# Patient Record
Sex: Male | Born: 2000
Health system: Southern US, Community
[De-identification: ages and names within clinical notes are randomized; demographics above are authoritative.]

## PROBLEM LIST (undated history)

## (undated) DIAGNOSIS — K589 Irritable bowel syndrome without diarrhea: Secondary | ICD-10-CM

## (undated) DIAGNOSIS — J45909 Unspecified asthma, uncomplicated: Secondary | ICD-10-CM

## (undated) HISTORY — DX: Irritable bowel syndrome without diarrhea: K58.9

## (undated) HISTORY — DX: Irritable bowel syndrome, unspecified: K58.9

---
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
## 2019-01-13 IMAGING — DX DG ABDOMEN 1V
1 series · 1 of 1 positions shown · non-contrast
Comparison: No recent.

CLINICAL DATA: Diarrhea.  Abdominal pain.

EXAM:
ABDOMEN - 1 VIEW

[dg abd 1 view]
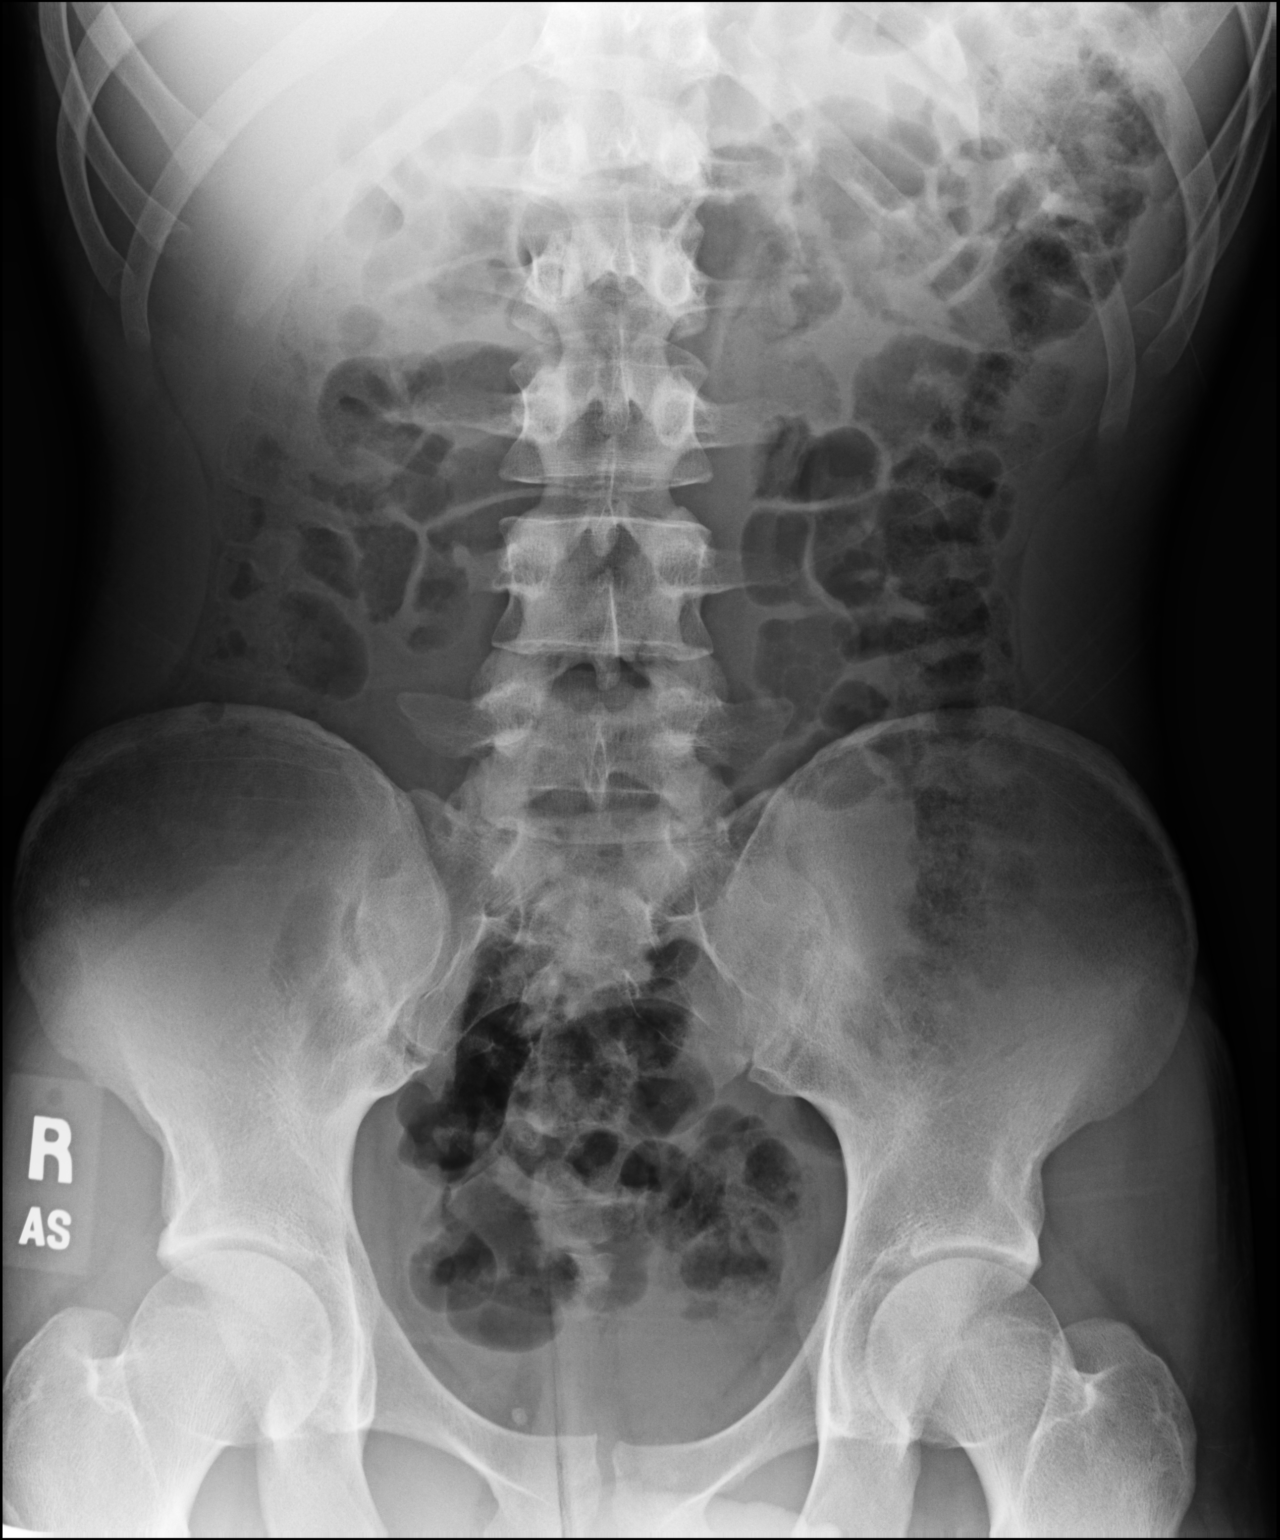

[1 of 1 positions shown; findings below may reference images not displayed]

FINDINGS: Soft tissue structures are unremarkable. No bowel distention or free
air. Pelvic calcifications consistent phleboliths. No acute bony
abnormality.
IMPRESSION: No acute abnormality.

## 2019-01-22 DIAGNOSIS — K580 Irritable bowel syndrome with diarrhea: Secondary | ICD-10-CM | POA: Insufficient documentation

## 2019-01-22 DIAGNOSIS — K589 Irritable bowel syndrome without diarrhea: Secondary | ICD-10-CM | POA: Insufficient documentation

## 2019-05-07 ENCOUNTER — Ambulatory Visit: Payer: Managed Care, Other (non HMO) | Admitting: Physician Assistant

## 2019-05-15 ENCOUNTER — Ambulatory Visit (INDEPENDENT_AMBULATORY_CARE_PROVIDER_SITE_OTHER): Payer: Self-pay | Admitting: Physician Assistant

## 2019-05-15 ENCOUNTER — Encounter: Payer: Self-pay | Admitting: Physician Assistant

## 2019-05-15 ENCOUNTER — Other Ambulatory Visit: Payer: Self-pay

## 2019-05-15 VITALS — BP 131/81 | HR 72 | Temp 97.3°F | Resp 14 | Ht 72.0 in | Wt 165.0 lb

## 2019-05-15 DIAGNOSIS — K582 Mixed irritable bowel syndrome: Secondary | ICD-10-CM

## 2019-05-15 MED ORDER — HYOSCYAMINE SULFATE 0.125 MG SL SUBL: 0.1250 mg | SUBLINGUAL_TABLET | Freq: Four times a day (QID) | SUBLINGUAL | 0 refills | Status: DC | PRN

## 2019-05-15 NOTE — Progress Notes (Signed)
I have discussed the procedure for the virtual visit with the patient who has given consent to proceed with assessment and treatment.   Jarious Lyon S Kensi Karr, CMA     

## 2019-05-15 NOTE — Progress Notes (Signed)
Patient presents to clinic today to establish care. 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 Wants to study engineering. Patient and mother note patient with a history of abdominal cramping and alternating constipation/diarrhea over the past few years. Has been followed by Peds GI with EGD and colonoscopy ruling out IBD. Was diagnosed with IBS-D and started on a diet, daily probiotic and Levsin PRN with good improvement. Still having several episodes of cramping per week. Is not taking Levsin currently. Denies blood in stool, melena, heartburn.  Exercise: 1-2 volleyball   Chronic Issues: Has history of asthma in childhood. No issue as an adult. Does have rescue inhaler just in case.   Health Maintenance: Immunizations -- UTD Colonoscopy -- March 2020.   Past Medical History:  Diagnosis Date  . Asthma     Past Surgical History:  Procedure Laterality Date  . NO PAST SURGERIES      Current Outpatient Medications on File Prior to Visit  Medication Sig Dispense Refill  . albuterol (VENTOLIN HFA) 108 (90 Base) MCG/ACT inhaler Inhale 1-2 puffs into the lungs every 6 (six) hours as needed for wheezing or shortness of breath.    . Aloe Vera 25 MG CAPS Take 1 capsule by mouth daily.    Marland Kitchen. saccharomyces boulardii (FLORASTOR) 250 MG capsule Take 1 tablet by mouth 2 (two) times a day.     No current facility-administered medications on file prior to visit.     No Known Allergies  Family History  Problem Relation Age of Onset  . Hypertension Mother   . Hyperlipidemia Mother   . Asthma Mother   . Melanoma Mother        Last year.  . Hypertension Father   . Healthy Sister   . Diabetes Maternal Grandmother   . Hypertension Maternal Grandmother   . Early death Maternal Grandfather   . Hypertension Paternal Grandmother   . Diabetes Paternal Grandfather   . Hyperlipidemia Paternal Grandfather   . Hypertension Paternal Grandfather   . Cancer Paternal  Grandfather        [facility]    Social History   Socioeconomic History  . Marital status: Single    Spouse name: Not on file  . Number of children: Not on file  . Years of education: Not on file  . Highest education level: Not on file  Occupational History  . Occupation: Consulting civil engineertudent    Comment: [employer]  Social Needs  . Financial resource strain: Not on file  . Food insecurity    Worry: Not on file    Inability: Not on file  . Transportation needs    Medical: Not on file    Non-medical: Not on file  Tobacco Use  . Smoking status: Never Smoker  . Smokeless tobacco: Never Used  Substance and Sexual Activity  . Alcohol use: No  . Drug use: No  . Sexual activity: Not on file  Lifestyle  . Physical activity    Days per week: Not on file    Minutes per session: Not on file  . Stress: Not on file  Relationships  . Social Musicianconnections    Talks on phone: Not on file    Gets together: Not on file    Attends religious service: Not on file    Active member of club or organization: Not on file    Attends meetings of clubs or organizations: Not on file    Relationship status: Not on file  .  Intimate partner violence    Fear of current or ex partner: Not on file    Emotionally abused: Not on file    Physically abused: Not on file    Forced sexual activity: Not on file  Other Topics Concern  . Not on file  Social History Narrative  . Not on file   Review of Systems  Constitutional: Negative for fever and weight loss.  HENT: Negative for ear discharge, ear pain, hearing loss and tinnitus.   Eyes: Negative for blurred vision, double vision, photophobia and pain.  Respiratory: Negative for cough and shortness of breath.   Cardiovascular: Negative for chest pain and palpitations.  Gastrointestinal: Positive for abdominal pain, constipation and diarrhea. Negative for blood in stool, heartburn, melena, nausea and vomiting.  Genitourinary: Negative for dysuria, flank pain,  frequency, hematuria and urgency.  Musculoskeletal: Negative for falls and myalgias.  Neurological: Negative for dizziness, loss of consciousness and headaches.  Endo/Heme/Allergies: Negative for environmental allergies.  Psychiatric/Behavioral: Negative for depression, hallucinations, substance abuse and suicidal ideas. The patient is not nervous/anxious and does not have insomnia.    BP 131/81   Pulse 72   Temp (!) 97.3 F (36.3 C) (Oral)   Resp 14   Ht 6' (1.829 m)   Wt 165 lb (74.8 kg)   BMI 22.38 kg/m   Physical Exam Vitals signs reviewed.  Constitutional:      General: He is not in acute distress.    Appearance: He is well-developed. He is not diaphoretic.  HENT:     Head: Normocephalic and atraumatic.     Right Ear: Tympanic membrane, ear canal and external ear normal.     Left Ear: Tympanic membrane, ear canal and external ear normal.     Nose: Nose normal.     Mouth/Throat:     Pharynx: No posterior oropharyngeal erythema.  Eyes:     Conjunctiva/sclera: Conjunctivae normal.     Pupils: Pupils are equal, round, and reactive to light.  Neck:     Musculoskeletal: Neck supple.     Thyroid: No thyromegaly.  Cardiovascular:     Rate and Rhythm: Normal rate and regular rhythm.     Heart sounds: Normal heart sounds.  Pulmonary:     Effort: Pulmonary effort is normal. No respiratory distress.     Breath sounds: Normal breath sounds. No wheezing or rales.  Chest:     Chest wall: No tenderness.  Abdominal:     General: Bowel sounds are normal. There is no distension.     Palpations: Abdomen is soft. There is no mass.     Tenderness: There is no abdominal tenderness. There is no guarding or rebound.  Lymphadenopathy:     Cervical: No cervical adenopathy.  Skin:    General: Skin is warm and dry.     Findings: No rash.  Neurological:     Mental Status: He is alert and oriented to person, place, and time.     Cranial Nerves: No cranial nerve deficit.     Assessment/Plan: 1. Irritable bowel syndrome with both constipation and diarrhea Restart Levsin. Continue probiotic and dietary changes. Referral to an adult GI practitioner placed.  - Ambulatory referral to Gastroenterology   Leeanne Rio, PA-C

## 2019-06-03 ENCOUNTER — Telehealth: Payer: Self-pay | Admitting: Gastroenterology

## 2019-06-03 NOTE — Telephone Encounter (Signed)
DOD 05-21-19 AM Dr. Fuller Plan  Patient turned 44 and is transferring care from a pediatric doctor.  Patient would like to establish care with our practice. Records will be sent to Dr. Fuller Plan (DOD) for review.

## 2019-06-04 NOTE — Telephone Encounter (Signed)
Left message to call back. Per Dr. Fuller Plan, pt needs a consult visit with him. Records in blue folder.

## 2019-06-18 NOTE — Telephone Encounter (Signed)
Left message to call back to schedule appt with APP per Dr. Lynne Leader note.

## 2019-06-18 NOTE — Telephone Encounter (Signed)
Hi Dr. Fuller Plan, Pt's PCP called requesting patient to be seen soon as he will be going back to school in September. Is it fine to schedule him with one of the APPs? Thank you

## 2019-06-18 NOTE — Telephone Encounter (Signed)
Yes please arrange an appt with an APP and I note that the patient is establishing care with me.

## 2019-06-19 ENCOUNTER — Encounter: Payer: Self-pay | Admitting: Gastroenterology

## 2019-06-19 NOTE — Telephone Encounter (Signed)
Consult scheduled with Janett Billow on 8/21 at 1:30pm.

## 2019-07-12 ENCOUNTER — Encounter: Payer: Self-pay | Admitting: Gastroenterology

## 2019-07-12 ENCOUNTER — Ambulatory Visit: Payer: Managed Care, Other (non HMO) | Admitting: Gastroenterology

## 2019-07-12 ENCOUNTER — Other Ambulatory Visit: Payer: Managed Care, Other (non HMO)

## 2019-07-12 VITALS — BP 110/60 | HR 72 | Temp 98.6°F | Ht 71.0 in | Wt 171.1 lb

## 2019-07-12 DIAGNOSIS — K589 Irritable bowel syndrome without diarrhea: Secondary | ICD-10-CM

## 2019-07-12 MED ORDER — DICYCLOMINE HCL 10 MG PO CAPS: 10.0000 mg | ORAL_CAPSULE | Freq: Two times a day (BID) | ORAL | 2 refills | Status: DC

## 2019-07-12 NOTE — Progress Notes (Signed)
07/12/2019 Matthew Harrington [id number] [birth year]   HISTORY OF PRESENT ILLNESS: This is an 18 year old male who is new to our office.  He is looking to establish care here, particularly with Dr. Fuller Plan, to continue to follow for management of his irritable bowel syndrome.  He was following with pediatric GI in [place], but is looking to transition now that he is 18 years old.  He had a normal EGD and colonoscopy in February of this year.  Is currently taking daily probiotics and using Levsin on an as-needed basis.  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  Looking for something to help him better manage his symptoms, possibly try to prevent symptoms to some degree.  Mostly complains of intermittent lower abdominal cramping.  Says that symptoms occur sometimes 2 or 3 days out of the week.  Hard to identify any particular foods that seem to trigger symptoms.  Gets some diarrhea/loose stools with urgency, but often times it is just the fact that he needs to visit the restroom frequently, not necessarily diarrhea per se.  TSH, sed rate, CRP normal/negative last year.  Does not recall being checked for celiac disease and I do no see that it was ever performed.  Is here today with his mom.   Past Medical History:  Diagnosis Date  . Asthma   . IBS (irritable bowel syndrome)    Past Surgical History:  Procedure Laterality Date  . NO PAST SURGERIES      reports that he has never smoked. He has never used smokeless tobacco. He reports that he does not drink alcohol or use drugs. family history includes Asthma in his mother; Breast cancer in his maternal grandmother; Colon polyps in his mother; Diabetes in his maternal grandmother and paternal grandfather; Early death in his maternal grandfather; Healthy in his sister; Heart disease in his maternal grandmother and mother; Hyperlipidemia in his father, mother, and paternal grandfather; Hypertension in his  father, maternal grandmother, mother, paternal grandfather, and paternal grandmother; Kidney cancer in his paternal grandfather; Melanoma in his mother. No Known Allergies    Outpatient Encounter Medications as of 07/12/2019  Medication Sig  . albuterol (VENTOLIN HFA) 108 (90 Base) MCG/ACT inhaler Inhale 1-2 puffs into the lungs every 6 (six) hours as needed for wheezing or shortness of breath.  . Aloe Vera 25 MG CAPS Take 1 capsule by mouth daily.  Marland Kitchen BLACK ELDERBERRY PO Take 1 Dose by mouth daily.  . cetirizine (ZYRTEC) 10 MG chewable tablet Chew 10 mg by mouth daily.  . Cholecalciferol (VITAMIN D) 50 MCG (2000 UT) tablet Take 2,000 Units by mouth daily.  . fluticasone (FLONASE) 50 MCG/ACT nasal spray Place 1 spray into both nostrils as needed for allergies or rhinitis.  . hyoscyamine (LEVSIN SL) 0.125 MG SL tablet Take 1 tablet (0.125 mg total) by mouth every 6 (six) hours as needed.  . Lactobacillus-Inulin ([place] PO) Take 1 tablet by mouth daily.  . Probiotic Product (PROBIOTIC PO) Take 1 tablet by mouth daily.  . vitamin C (ASCORBIC ACID) 500 MG tablet Take 500 mg by mouth daily.  . [DISCONTINUED] saccharomyces boulardii (FLORASTOR) 250 MG capsule Take 1 tablet by mouth 2 (two) times a day.   No facility-administered encounter medications on file as of 07/12/2019.      REVIEW OF SYSTEMS  : All other systems reviewed and negative except where noted in the History of Present Illness.  PHYSICAL EXAM: BP 110/60 (BP Location: Left Arm, Patient Position: Sitting, Cuff Size: Normal)   Pulse 72   Temp 98.6 F (37 C)   Ht 5\' 11"  (1.803 m) Comment: height measured without shoes  Wt 171 lb 2 oz (77.6 kg)   BMI 23.87 kg/m  General: Well developed white male in no acute distress Head: Normocephalic and atraumatic Eyes:  Sclerae anicteric, conjunctiva pink. Ears: Normal auditory acuity Lungs: Clear throughout to auscultation; no increased WOB. Heart: Regular rate  and rhythm; no M/R/G. Abdomen: Soft, non-distended.  BS present.  Non-tender. Musculoskeletal: Symmetrical with no gross deformities  Skin: No lesions on visible extremities Extremities: No edema  Neurological: Alert oriented x 4, grossly non-focal Psychological:  Alert and cooperative. Normal mood and affect  ASSESSMENT AND PLAN: *18 year old male with anxiety and IBS: [place] but now establishing care here.  Normal colonoscopy and EGD earlier this year.  The only other thing that I would do as far as evaluation would be to check for celiac disease which does not look like it was done in the past.  Will order those labs today.  Otherwise focus on symptom management.  Reports symptoms sometimes 2 or 3 days of the week.  Looking for something to try to prevent symptoms.  Will place him on Bentyl 10 mg once daily, increase to twice daily if needed.  Currently has Levsin, which he is just using as needed and can continue to use that on occasion for more fast acting medication if needed, but advised not to use both together regularly.  Imodium as needed.  Consider adding Benefiber or Citrucel powder daily to help bulk the stools.  Continue daily probiotic.  CC:  Waldon MerlMartin, William C, PA-C

## 2019-07-12 NOTE — Patient Instructions (Signed)
Normal BMI (Body Mass Index- based on height and weight) is between 19 and 25. Your BMI today is Body mass index is 23.87 kg/m. Marland Kitchen Please consider follow up  regarding your BMI with your Primary Care Provider.  Your provider has requested that you go to the basement level for lab work before leaving today. Press "B" on the elevator. The lab is located at the first door on the left as you exit the elevator.   We have sent the following medications to your pharmacy for you to pick up at your convenience: Bentyl  Consider adding Benefiber ir Citrucel fiber supplements daily to help bulk stools.  Thank you for [facility].

## 2019-07-12 NOTE — Progress Notes (Signed)
Reviewed and agree with management plan.  Jhade Berko T. Devlyn Parish, MD FACG [facility]  

## 2019-07-15 LAB — TISSUE TRANSGLUTAMINASE, IGA: (tTG) Ab, IgA: 1 U/mL

## 2019-07-15 LAB — IGA: Immunoglobulin A: 131 mg/dL (ref 47–310)

## 2019-11-09 ENCOUNTER — Other Ambulatory Visit: Payer: Self-pay | Admitting: Gastroenterology

## 2020-03-11 ENCOUNTER — Other Ambulatory Visit: Payer: Self-pay | Admitting: Gastroenterology

## 2020-04-02 ENCOUNTER — Telehealth: Payer: Self-pay | Admitting: Gastroenterology

## 2020-04-02 ENCOUNTER — Telehealth: Payer: Self-pay | Admitting: Physician Assistant

## 2020-04-02 NOTE — Telephone Encounter (Signed)
Pt called in stating that he was dx with covid in late Jan. He never has symptoms just was tested because his mother tested positive. He would like to know when Selena Batten recommends he get his 1st covid Vac. Please advise

## 2020-04-02 NOTE — Telephone Encounter (Signed)
Patients mother is calling- requesting refill for dicyclomine. They are asking if they can get a 90 day supply rather than 30 because son is away at school and harder to get refills.

## 2020-04-03 MED ORDER — DICYCLOMINE HCL 10 MG PO CAPS: 10.0000 mg | ORAL_CAPSULE | Freq: Two times a day (BID) | ORAL | 1 refills | Status: DC

## 2020-04-03 NOTE — Telephone Encounter (Signed)
Sent 90 day supply script to patient's pharmacy.

## 2020-04-03 NOTE — Telephone Encounter (Signed)
He can go ahead and get now.

## 2020-04-03 NOTE — Telephone Encounter (Signed)
Called patient to inform of PCP recommendations. Patient voiced understanding. 

## 2020-11-10 ENCOUNTER — Encounter: Payer: Managed Care, Other (non HMO) | Admitting: Physician Assistant

## 2020-11-11 ENCOUNTER — Encounter: Payer: Self-pay | Admitting: Physician Assistant

## 2020-11-11 ENCOUNTER — Other Ambulatory Visit: Payer: Self-pay

## 2020-11-11 ENCOUNTER — Ambulatory Visit (INDEPENDENT_AMBULATORY_CARE_PROVIDER_SITE_OTHER): Payer: Managed Care, Other (non HMO) | Admitting: Physician Assistant

## 2020-11-11 VITALS — BP 108/70 | HR 94 | Temp 98.5°F | Resp 14 | Ht 72.0 in | Wt 162.0 lb

## 2020-11-11 DIAGNOSIS — Z1159 Encounter for screening for other viral diseases: Secondary | ICD-10-CM | POA: Diagnosis not present

## 2020-11-11 DIAGNOSIS — Z0000 Encounter for general adult medical examination without abnormal findings: Secondary | ICD-10-CM | POA: Diagnosis not present

## 2020-11-11 NOTE — Progress Notes (Signed)
Patient presents to clinic today for annual exam.  Patient is fasting for labs. Endorses well-balanced diet overall. Is trying to keep active when he is not too busy with school. Notes sleeping very well without issue.   Health Maintenance: Immunizations -- TDaP up-to-date.  HIV/Hep C Screening -- Declines HIV screen. Agrees to Hep C screen.  Past Medical History:  Diagnosis Date  . Asthma   . IBS (irritable bowel syndrome)     Past Surgical History:  Procedure Laterality Date  . NO PAST SURGERIES      Current Outpatient Medications on File Prior to Visit  Medication Sig Dispense Refill  . albuterol (VENTOLIN HFA) 108 (90 Base) MCG/ACT inhaler Inhale 1-2 puffs into the lungs every 6 (six) hours as needed for wheezing or shortness of breath.    . Aloe Vera 25 MG CAPS Take 1 capsule by mouth daily.    Marland Kitchen BLACK ELDERBERRY PO Take 1 Dose by mouth daily.    . cetirizine (ZYRTEC) 10 MG chewable tablet Chew 10 mg by mouth daily.    . Cholecalciferol (VITAMIN D) 50 MCG (2000 UT) tablet Take 2,000 Units by mouth daily.    Marland Kitchen dicyclomine (BENTYL) 10 MG capsule Take 1 capsule (10 mg total) by mouth 2 (two) times daily. 180 capsule 1  . fluticasone (FLONASE) 50 MCG/ACT nasal spray Place 1 spray into both nostrils as needed for allergies or rhinitis.    . hyoscyamine (LEVSIN SL) 0.125 MG SL tablet Take 1 tablet (0.125 mg total) by mouth every 6 (six) hours as needed. 30 tablet 0  . Lactobacillus-Inulin (CULTURELLE HEALTH & WELLNESS PO) Take 1 tablet by mouth daily.    . Probiotic Product (PROBIOTIC PO) Take 1 tablet by mouth daily.    . vitamin C (ASCORBIC ACID) 500 MG tablet Take 500 mg by mouth daily.     No current facility-administered medications on file prior to visit.    No Known Allergies  Family History  Problem Relation Age of Onset  . Hypertension Mother   . Hyperlipidemia Mother   . Asthma Mother   . Melanoma Mother        Last year.  . Colon polyps Mother   . Heart  disease Mother   . Hypertension Father   . Hyperlipidemia Father   . Healthy Sister   . Diabetes Maternal Grandmother   . Hypertension Maternal Grandmother   . Breast cancer Maternal Grandmother   . Heart disease Maternal Grandmother   . Early death Maternal Grandfather   . Hypertension Paternal Grandmother   . Diabetes Paternal Grandfather   . Hyperlipidemia Paternal Grandfather   . Hypertension Paternal Grandfather   . Kidney cancer Paternal Grandfather        [facility]    Social History   Socioeconomic History  . Marital status: Single    Spouse name: Not on file  . Number of children: 0  . Years of education: Not on file  . Highest education level: Not on file  Occupational History  . Occupation: Student    Comment: [employer]  Tobacco Use  . Smoking status: Never Smoker  . Smokeless tobacco: Never Used  Vaping Use  . Vaping Use: Never used  Substance and Sexual Activity  . Alcohol use: No  . Drug use: No  . Sexual activity: Not on file  Other Topics Concern  . Not on file  Social History Narrative  . Not on file   Social Determinants of Health  Financial Resource Strain: Not on file  Food Insecurity: Not on file  Transportation Needs: Not on file  Physical Activity: Not on file  Stress: Not on file  Social Connections: Not on file  Intimate Partner Violence: Not on file   Review of Systems  Constitutional: Negative for fever and weight loss.  HENT: Negative for ear discharge, ear pain, hearing loss and tinnitus.   Eyes: Negative for blurred vision, double vision, photophobia and pain.  Respiratory: Negative for cough and shortness of breath.   Cardiovascular: Negative for chest pain and palpitations.  Gastrointestinal: Negative for abdominal pain, blood in stool, constipation, diarrhea, heartburn, melena, nausea and vomiting.  Genitourinary: Negative for dysuria, flank pain, frequency, hematuria and urgency.  Musculoskeletal: Negative for  falls.  Neurological: Negative for dizziness, loss of consciousness and headaches.  Endo/Heme/Allergies: Negative for environmental allergies.  Psychiatric/Behavioral: Negative for depression, hallucinations, substance abuse and suicidal ideas. The patient is not nervous/anxious and does not have insomnia.    BP 108/70   Pulse 94   Temp 98.5 F (36.9 C) (Temporal)   Resp 14   Ht 6' (1.829 m)   Wt 162 lb (73.5 kg)   SpO2 98%   BMI 21.97 kg/m   Physical Exam Vitals reviewed.  Constitutional:      General: He is not in acute distress.    Appearance: Normal appearance. He is well-developed and well-nourished. He is not diaphoretic.  HENT:     Head: Normocephalic and atraumatic.     Right Ear: Tympanic membrane, ear canal and external ear normal.     Left Ear: Tympanic membrane, ear canal and external ear normal.     Nose: Nose normal.     Mouth/Throat:     Mouth: Oropharynx is clear and moist and mucous membranes are normal.     Pharynx: No posterior oropharyngeal edema or posterior oropharyngeal erythema.  Eyes:     Conjunctiva/sclera: Conjunctivae normal.     Pupils: Pupils are equal, round, and reactive to light.  Neck:     Thyroid: No thyromegaly.  Cardiovascular:     Rate and Rhythm: Normal rate and regular rhythm.     Pulses: Normal pulses and intact distal pulses.     Heart sounds: Normal heart sounds.  Pulmonary:     Effort: Pulmonary effort is normal. No respiratory distress.     Breath sounds: Normal breath sounds. No wheezing or rales.  Chest:     Chest wall: No tenderness.  Abdominal:     General: Bowel sounds are normal. There is no distension.     Palpations: Abdomen is soft. There is no mass.     Tenderness: There is no abdominal tenderness. There is no guarding or rebound.  Musculoskeletal:     Cervical back: Neck supple.  Lymphadenopathy:     Cervical: No cervical adenopathy.  Skin:    General: Skin is warm and dry.     Findings: No rash.   Neurological:     General: No focal deficit present.     Mental Status: He is alert and oriented to person, place, and time.     Cranial Nerves: No cranial nerve deficit.  Psychiatric:        Mood and Affect: Mood and affect and mood normal.    Assessment/Plan: 1. Visit for preventive health examination Depression screen negative. Health Maintenance reviewed. Preventive schedule discussed and handout given in AVS. Will obtain fasting labs today.  - CBC with Differential/Platelet - Comprehensive metabolic panel -  Lipid panel  2. Need for hepatitis C screening test - Hepatitis C Antibody   This visit occurred during the SARS-CoV-2 public health emergency.  Safety protocols were in place, including screening questions prior to the visit, additional usage of staff PPE, and extensive cleaning of exam room while observing appropriate contact time as indicated for disinfecting solutions.    Piedad Climes, PA-C

## 2020-11-11 NOTE — Patient Instructions (Signed)
Please go to the lab for blood work.   Our office will call you with your results unless you have chosen to receive results via MyChart.  If your blood work is normal we will follow-up each year for physicals and as scheduled for chronic medical problems.  If anything is abnormal we will treat accordingly and get you in for a follow-up.   Preventive Care 18-21 Years Old, Male Preventive care refers to lifestyle choices and visits with your health care provider that can promote health and wellness. At this stage in your life, you may start seeing a primary care physician instead of a pediatrician. Your health care is now your responsibility. Preventive care for young adults includes:  A yearly physical exam. This is also called an annual wellness visit.  Regular dental and eye exams.  Immunizations.  Screening for certain conditions.  Healthy lifestyle choices, such as diet and exercise. What can I expect for my preventive care visit? Physical exam Your health care provider may check:  Height and weight. These may be used to calculate body mass index (BMI), which is a measurement that tells if you are at a healthy weight.  Heart rate and blood pressure.  Body temperature. Counseling Your health care provider may ask you questions about:  Past medical problems and family medical history.  Alcohol, tobacco, and drug use.  Home and relationship well-being.  Access to firearms.  Emotional well-being.  Diet, exercise, and sleep habits.  Sexual activity and sexual health. What immunizations do I need?  Influenza (flu) vaccine  This is recommended every year. Tetanus, diphtheria, and pertussis (Tdap) vaccine  You may need a Td booster every 10 years. Varicella (chickenpox) vaccine  You may need this vaccine if you have not already been vaccinated. Human papillomavirus (HPV) vaccine  If recommended by your health care provider, you may need three doses over 6  months. Measles, mumps, and rubella (MMR) vaccine  You may need at least one dose of MMR. You may also need a second dose. Meningococcal conjugate (MenACWY) vaccine  One dose is recommended if you are 19-21 years old and a first-year college student living in a residence hall, or if you have one of several medical conditions. You may also need additional booster doses. Pneumococcal conjugate (PCV13) vaccine  You may need this if you have certain conditions and were not previously vaccinated. Pneumococcal polysaccharide (PPSV23) vaccine  You may need one or two doses if you smoke cigarettes or if you have certain conditions. Hepatitis A vaccine  You may need this if you have certain conditions or if you travel or work in places where you may be exposed to hepatitis A. Hepatitis B vaccine  You may need this if you have certain conditions or if you travel or work in places where you may be exposed to hepatitis B. Haemophilus influenzae type b (Hib) vaccine  You may need this if you have certain risk factors. You may receive vaccines as individual doses or as more than one vaccine together in one shot (combination vaccines). Talk with your health care provider about the risks and benefits of combination vaccines. What tests do I need? Blood tests  Lipid and cholesterol levels. These may be checked every 5 years starting at age 20.  Hepatitis C test.  Hepatitis B test. Screening  Genital exam to check for testicular cancer or hernias.  Sexually transmitted disease (STD) testing, if you are at risk. Other tests  Tuberculosis skin test.  Vision   and hearing tests.  Skin exam. Follow these instructions at home: Eating and drinking   Eat a diet that includes fresh fruits and vegetables, whole grains, lean protein, and low-fat dairy products.  Drink enough fluid to keep your urine pale yellow.  Do not drink alcohol if: ? Your health care provider tells you not to  drink. ? You are under the legal drinking age. In the [place] the legal drinking age is 21.  If you drink alcohol: ? Limit how much you have to 0-2 drinks a day. ? Be aware of how much alcohol is in your drink. In the [place] one drink equals one 12 oz bottle of beer (355 mL), one 5 oz glass of wine (148 mL), or one 1 oz glass of hard liquor (44 mL). Lifestyle  Take daily care of your teeth and gums.  Stay active. Exercise at least 30 minutes 5 or more days of the week.  Do not use any products that contain nicotine or tobacco, such as cigarettes, e-cigarettes, and chewing tobacco. If you need help quitting, ask your health care provider.  Do not use drugs.  If you are sexually active, practice safe sex. Use a condom or other form of protection to prevent STIs (sexually transmitted infections).  Find healthy ways to cope with stress, such as: ? Meditation, yoga, or listening to music. ? Journaling. ? Talking to a trusted person. ? Spending time with friends and family. Safety  Always wear your seat belt while driving or riding in a vehicle.  Do not drive if you have been drinking alcohol.  Do not ride with someone who has been drinking.  Do not drive when you are tired or distracted.  Do not text while driving.  Wear a helmet and other protective equipment during sports activities.  If you have firearms in your house, make sure you follow all gun safety procedures.  Seek help if you have been bullied, physically abused, or sexually abused.  Use the Internet responsibly to avoid dangers such as online bullying and online sex predators. What's next?  Go to your health care provider once a year for a well check visit.  Ask your health care provider how often you should have your eyes and teeth checked.  Stay up to date on all vaccines. This information is not intended to replace advice given to you by your health care provider. Make sure you discuss any questions you have  with your health care provider. Document Revised: 11/01/2018 Document Reviewed: 11/01/2018 Elsevier Patient Education  2020 Elsevier Inc. .      

## 2020-11-12 LAB — COMPREHENSIVE METABOLIC PANEL
ALT: 10 U/L (ref 0–53)
AST: 12 U/L (ref 0–37)
Albumin: 4.7 g/dL (ref 3.5–5.2)
Alkaline Phosphatase: 50 U/L (ref 39–117)
BUN: 15 mg/dL (ref 6–23)
CO2: 29 mEq/L (ref 19–32)
Calcium: 9.4 mg/dL (ref 8.4–10.5)
Chloride: 104 mEq/L (ref 96–112)
Creatinine, Ser: 1 mg/dL (ref 0.40–1.50)
GFR: 108.72 mL/min (ref >60.00)
Glucose, Bld: 94 mg/dL (ref 70–99)
Potassium: 4.4 mEq/L (ref 3.5–5.1)
Sodium: 138 mEq/L (ref 135–145)
Total Bilirubin: 0.7 mg/dL (ref 0.2–1.2)
Total Protein: 7 g/dL (ref 6.0–8.3)

## 2020-11-12 LAB — CBC WITH DIFFERENTIAL/PLATELET
Basophils Absolute: 0 10*3/uL (ref 0.0–0.1)
Basophils Relative: 1 % (ref 0.0–3.0)
Eosinophils Absolute: 0.1 10*3/uL (ref 0.0–0.7)
Eosinophils Relative: 2.4 % (ref 0.0–5.0)
HCT: 39.2 % (ref 39.0–52.0)
Hemoglobin: 13.5 g/dL (ref 13.0–17.0)
Lymphocytes Relative: 38.3 % (ref 12.0–46.0)
Lymphs Abs: 2 10*3/uL (ref 0.7–4.0)
MCHC: 34.4 g/dL (ref 30.0–36.0)
MCV: 82.7 fl (ref 78.0–100.0)
Monocytes Absolute: 0.6 10*3/uL (ref 0.1–1.0)
Monocytes Relative: 11.7 % (ref 3.0–12.0)
Neutro Abs: 2.4 10*3/uL (ref 1.4–7.7)
Neutrophils Relative %: 46.6 % (ref 43.0–77.0)
Platelets: 244 10*3/uL (ref 150.0–400.0)
RBC: 4.74 Mil/uL (ref 4.22–5.81)
RDW: 13.7 % (ref 11.5–14.6)
WBC: 5.2 10*3/uL (ref 4.5–10.5)

## 2020-11-12 LAB — LIPID PANEL
Cholesterol: 150 mg/dL (ref 0–200)
HDL: 49.2 mg/dL (ref >39.00)
LDL Cholesterol: 82 mg/dL (ref 0–99)
NonHDL: 101.23
Total CHOL/HDL Ratio: 3
Triglycerides: 94 mg/dL (ref 0.0–149.0)
VLDL: 18.8 mg/dL (ref 0.0–40.0)

## 2020-11-24 ENCOUNTER — Ambulatory Visit: Payer: Managed Care, Other (non HMO) | Admitting: Gastroenterology

## 2021-03-02 ENCOUNTER — Other Ambulatory Visit: Payer: Self-pay | Admitting: Gastroenterology

## 2021-03-19 ENCOUNTER — Other Ambulatory Visit: Payer: Self-pay | Admitting: Gastroenterology

## 2021-03-19 ENCOUNTER — Encounter: Payer: Self-pay | Admitting: Gastroenterology

## 2021-03-19 ENCOUNTER — Other Ambulatory Visit: Payer: Self-pay

## 2021-03-19 ENCOUNTER — Telehealth: Payer: Self-pay | Admitting: Gastroenterology

## 2021-03-19 MED ORDER — DICYCLOMINE HCL 10 MG PO CAPS: 10.0000 mg | ORAL_CAPSULE | Freq: Two times a day (BID) | ORAL | 0 refills | Status: DC

## 2021-03-19 NOTE — Telephone Encounter (Signed)
Outpatient Medication Detail   Disp Refills Start End   dicyclomine (BENTYL) 10 MG capsule 60 capsule 0 03/19/2021    Sig - Route: Take 1 capsule (10 mg total) by mouth 2 (two) times daily. - Oral   Sent to pharmacy as: dicyclomine (BENTYL) 10 MG capsule   E-Prescribing Status: Receipt confirmed by pharmacy (03/19/2021  4:21 PM EDT)

## 2021-03-19 NOTE — Telephone Encounter (Signed)
Inbound call from mother. Patient need medication refill for dicyclomine. He have appointment scheduled for 04/09/21

## 2021-04-09 ENCOUNTER — Ambulatory Visit: Payer: Managed Care, Other (non HMO) | Admitting: Gastroenterology

## 2021-05-06 ENCOUNTER — Other Ambulatory Visit: Payer: Self-pay | Admitting: Gastroenterology

## 2021-05-06 ENCOUNTER — Ambulatory Visit: Payer: BC Managed Care – PPO | Admitting: Gastroenterology

## 2021-05-06 ENCOUNTER — Encounter: Payer: Self-pay | Admitting: Gastroenterology

## 2021-05-06 VITALS — BP 108/66 | HR 74 | Ht 72.0 in | Wt 162.4 lb

## 2021-05-06 DIAGNOSIS — K589 Irritable bowel syndrome without diarrhea: Secondary | ICD-10-CM

## 2021-05-06 MED ORDER — DICYCLOMINE HCL 10 MG PO CAPS: 10.0000 mg | ORAL_CAPSULE | Freq: Two times a day (BID) | ORAL | 4 refills | Status: DC

## 2021-05-06 MED ORDER — HYOSCYAMINE SULFATE 0.125 MG SL SUBL: 0.1250 mg | SUBLINGUAL_TABLET | Freq: Four times a day (QID) | SUBLINGUAL | 2 refills | Status: AC | PRN

## 2021-05-06 NOTE — Progress Notes (Signed)
05/06/2021 Matthew Harrington [id number] 2001-10-09   HISTORY OF PRESENT ILLNESS: This is a 20 year old male who was seen by me in August 2020 to establish GI care.  He had previously been followed by pediatric GI in [place].  His care was assigned to Dr. Russella Dar at his initial visit here.  He has a history of anxiety and IBS.  He had a normal EGD and colonoscopy in February 2020.  He is here today to get refills on his medications.  I had prescribed Bentyl 10 mg twice daily and then he was going to use Levsin on an as-needed basis.  He reports that he is taking the Bentyl twice a day and only needs the Levsin maybe 2 or 3 times a month.  Is doing really well.  Is really satisfied with his symptom management at this time.  Continues on a daily probiotic.   Past Medical History:  Diagnosis Date   Asthma    IBS (irritable bowel syndrome)    Past Surgical History:  Procedure Laterality Date   NO PAST SURGERIES      reports that he has never smoked. He has never used smokeless tobacco. He reports that he does not drink alcohol and does not use drugs. family history includes Asthma in his mother; Breast cancer in his maternal grandmother; Colon polyps in his mother; Diabetes in his maternal grandmother and paternal grandfather; Early death in his maternal grandfather; Healthy in his sister; Heart disease in his maternal grandmother and mother; Hyperlipidemia in his father, mother, and paternal grandfather; Hypertension in his father, maternal grandmother, mother, paternal grandfather, and paternal grandmother; Kidney cancer in his paternal grandfather; Melanoma in his mother. No Known Allergies    Outpatient Encounter Medications as of 05/06/2021  Medication Sig   albuterol (VENTOLIN HFA) 108 (90 Base) MCG/ACT inhaler Inhale 1-2 puffs into the lungs every 6 (six) hours as needed for wheezing or shortness of breath.   Aloe Vera 25 MG CAPS Take 1 capsule by mouth daily.   BLACK ELDERBERRY PO  Take 1 Dose by mouth daily.   cetirizine (ZYRTEC) 10 MG chewable tablet Chew 10 mg by mouth daily.   Cholecalciferol (VITAMIN D) 50 MCG (2000 UT) tablet Take 2,000 Units by mouth daily.   dicyclomine (BENTYL) 10 MG capsule Take 1 capsule (10 mg total) by mouth 2 (two) times daily.   fluticasone (FLONASE) 50 MCG/ACT nasal spray Place 1 spray into both nostrils as needed for allergies or rhinitis.   hyoscyamine (LEVSIN SL) 0.125 MG SL tablet Take 1 tablet (0.125 mg total) by mouth every 6 (six) hours as needed.   Lactobacillus-Inulin (CULTURELLE HEALTH & WELLNESS PO) Take 1 tablet by mouth daily.   Probiotic Product (PROBIOTIC PO) Take 1 tablet by mouth daily.   vitamin C (ASCORBIC ACID) 500 MG tablet Take 500 mg by mouth daily.   No facility-administered encounter medications on file as of 05/06/2021.     REVIEW OF SYSTEMS  : All other systems reviewed and negative except where noted in the History of Present Illness.   PHYSICAL EXAM: BP 108/66   Pulse 74   Ht 6' (1.829 m)   Wt 162 lb 6.4 oz (73.7 kg)   BMI 22.03 kg/m  General: Well developed white male in no acute distress Head: Normocephalic and atraumatic Eyes:  Sclerae anicteric, conjunctiva pink. Ears: Normal auditory acuity Lungs: Clear throughout to auscultation; no W/R/R. Heart: Regular rate and rhythm; no M/R/G. Abdomen: Soft, non-distended.  BS present.  Non-tender. Musculoskeletal: Symmetrical with no gross deformities  Skin: No lesions on visible extremities Extremities: No edema  Neurological: Alert oriented x 4, grossly non-focal Psychological:  Alert and cooperative. Normal mood and affect  ASSESSMENT AND PLAN: *20 year old male with anxiety and IBS: Followed previously with peds GI in [place], but now following here.  Normal colonoscopy and EGD in 2020.  He is doing well on Bentyl 10 mg twice daily.  Uses Levsin in addition to the Bentyl about 2-3 times a month.  Is requesting refills for both of those  medications.  We will send those to his pharmacy.  Continue daily probiotic as well.  Follow-up in a year for further refills or sooner if needed for any other issues.  CC:  Waldon Merl, PA-C

## 2021-05-06 NOTE — Progress Notes (Signed)
Reviewed and agree with management plan.  Emma Schupp T. Tahsin Benyo, MD FACG [phone]  

## 2021-05-06 NOTE — Patient Instructions (Signed)
We have sent the following medications to your pharmacy for you to pick up at your convenience: Bentyl 10 mg twice daily. Levsin every 6 hours as needed.  Follow up in 1 year or sooner if needed.  If you are age 21 or older, your body mass index should be between 23-30. Your Body mass index is 22.03 kg/m. If this is out of the aforementioned range listed, please consider follow up with your Primary Care Provider.  If you are age 80 or younger, your body mass index should be between 19-25. Your Body mass index is 22.03 kg/m. If this is out of the aformentioned range listed, please consider follow up with your Primary Care Provider.   __________________________________________________________  The [facility] providers would like to encourage you to use [facility] to communicate with providers for non-urgent requests or questions.  Due to long hold times on the telephone, sending your provider a message by [facility] may be a faster and more efficient way to get a response.  Please allow 48 business hours for a response.  Please remember that this is for non-urgent requests.

## 2021-05-20 ENCOUNTER — Other Ambulatory Visit: Payer: Self-pay

## 2021-05-20 ENCOUNTER — Encounter: Payer: Self-pay | Admitting: Registered Nurse

## 2021-05-20 ENCOUNTER — Ambulatory Visit: Payer: BC Managed Care – PPO | Admitting: Registered Nurse

## 2021-05-20 VITALS — BP 123/75 | HR 75 | Temp 98.3°F | Resp 18 | Ht 72.0 in | Wt 158.4 lb

## 2021-05-20 DIAGNOSIS — Z7689 Persons encountering health services in other specified circumstances: Secondary | ICD-10-CM

## 2021-05-20 DIAGNOSIS — K581 Irritable bowel syndrome with constipation: Secondary | ICD-10-CM

## 2021-05-20 DIAGNOSIS — J4520 Mild intermittent asthma, uncomplicated: Secondary | ICD-10-CM | POA: Diagnosis not present

## 2021-05-20 NOTE — Progress Notes (Signed)
Established Patient Office Visit  Subjective:  Patient ID: Matthew Harrington, male    DOB: 29-Aug-2001  Age: 20 y.o. MRN: [id number]  CC:  Chief Complaint  Patient presents with   Transitions Of Care    Patient states he is here for [facility] patient states he has no other concerns. Just establishing care.    HPI Matthew Harrington presents for visit to est care.  Formerly pt of [place], [place].  Histories reviewed and updated with patient.   Asthma Mild intermittent. Occ use of albuterol, very sparing Does not need refills at this time No new or worsening symptoms.  IBS Stable. Has been on hyoscyamine 0.125mg  po q6h PRN (uses 2-3 times monthly) and bentyl 10mg  po bid daily through [facility], previously through [facility]. Also takes daily probiotic. No new concerns or symptoms  Exercise and diet Concerns on what is appropriate amounts  Occ weight lifting and walking / jogging   He is a at [employer] [employer]  Past Medical History:  Diagnosis Date   Asthma    IBS (irritable bowel syndrome)     Past Surgical History:  Procedure Laterality Date   NO PAST SURGERIES      Family History  Problem Relation Age of Onset   Hypertension Mother    Hyperlipidemia Mother    Asthma Mother    Melanoma Mother        Last year.   Colon polyps Mother    Heart disease Mother    Hypertension Father    Hyperlipidemia Father    Healthy Sister    Diabetes Maternal Grandmother    Hypertension Maternal Grandmother    Breast cancer Maternal Grandmother    Heart disease Maternal Grandmother    Early death Maternal Grandfather    Hypertension Paternal Grandmother    Diabetes Paternal Grandfather    Hyperlipidemia Paternal Grandfather    Hypertension Paternal Grandfather    Kidney cancer Paternal Grandfather        RCC    Social History   Socioeconomic History   Marital status: Single    Spouse name: Not on file   Number of children: 0   Years of education: Not on file   Highest  education level: Not on file  Occupational History   Occupation: [employer]    Comment: [employer]  Tobacco Use   Smoking status: Never   Smokeless tobacco: Never  Vaping Use   Vaping Use: Never used  Substance and Sexual Activity   Alcohol use: No   Drug use: No   Sexual activity: Not on file  Other Topics Concern   Not on file  Social History Narrative   Not on file   Social Determinants of Health   Financial Resource Strain: Not on file  Food Insecurity: Not on file  Transportation Needs: Not on file  Physical Activity: Not on file  Stress: Not on file  Social Connections: Not on file  Intimate Partner Violence: Not on file    Outpatient Medications Prior to Visit  Medication Sig Dispense Refill   albuterol (VENTOLIN HFA) 108 (90 Base) MCG/ACT inhaler Inhale 1-2 puffs into the lungs every 6 (six) hours as needed for wheezing or shortness of breath.     Aloe Vera 25 MG CAPS Take 1 capsule by mouth daily.     BLACK ELDERBERRY PO Take 1 Dose by mouth daily.     cetirizine (ZYRTEC) 10 MG chewable tablet Chew 10 mg by mouth  daily.     Cholecalciferol (VITAMIN D) 50 MCG (2000 UT) tablet Take 2,000 Units by mouth daily.     dicyclomine (BENTYL) 10 MG capsule Take 1 capsule (10 mg total) by mouth 2 (two) times daily. 180 capsule 4   fluticasone (FLONASE) 50 MCG/ACT nasal spray Place 1 spray into both nostrils as needed for allergies or rhinitis.     hyoscyamine (LEVSIN SL) 0.125 MG SL tablet Take 1 tablet (0.125 mg total) by mouth every 6 (six) hours as needed. 30 tablet 2   Lactobacillus-Inulin (CULTURELLE HEALTH & WELLNESS PO) Take 1 tablet by mouth daily.     Probiotic Product (PROBIOTIC PO) Take 1 tablet by mouth daily.     vitamin C (ASCORBIC ACID) 500 MG tablet Take 500 mg by mouth daily.     No facility-administered medications prior to visit.    No Known Allergies  ROS Review of Systems  Constitutional: Negative.   HENT: Negative.    Eyes: Negative.    Respiratory: Negative.    Cardiovascular: Negative.   Gastrointestinal: Negative.   Endocrine: Negative.   Genitourinary: Negative.   Musculoskeletal: Negative.   Skin: Negative.   Allergic/Immunologic: Negative.   Neurological: Negative.   Hematological: Negative.   Psychiatric/Behavioral: Negative.    All other systems reviewed and are negative.    Objective:    Physical Exam Constitutional:      General: He is not in acute distress.    Appearance: Normal appearance. He is normal weight. He is not ill-appearing, toxic-appearing or diaphoretic.  Cardiovascular:     Rate and Rhythm: Normal rate and regular rhythm.     Heart sounds: Normal heart sounds. No murmur heard.   No friction rub. No gallop.  Pulmonary:     Effort: Pulmonary effort is normal. No respiratory distress.     Breath sounds: Normal breath sounds. No stridor. No wheezing, rhonchi or rales.  Chest:     Chest wall: No tenderness.  Neurological:     General: No focal deficit present.     Mental Status: He is alert and oriented to person, place, and time. Mental status is at baseline.  Psychiatric:        Mood and Affect: Mood normal.        Behavior: Behavior normal.        Thought Content: Thought content normal.        Judgment: Judgment normal.    BP 123/75   Pulse 75   Temp 98.3 F (36.8 C) (Temporal)   Resp 18   Ht 6' (1.829 m)   Wt 158 lb 6.4 oz (71.8 kg)   SpO2 99%   BMI 21.48 kg/m  Wt Readings from Last 3 Encounters:  05/20/21 158 lb 6.4 oz (71.8 kg)  05/06/21 162 lb 6.4 oz (73.7 kg)  11/11/20 162 lb (73.5 kg)     There are no preventive care reminders to display for this patient.  There are no preventive care reminders to display for this patient.  Lab Results  Component Value Date   TSH 0.921 02/19/2018   Lab Results  Component Value Date   WBC 5.2 11/11/2020   HGB 13.5 11/11/2020   HCT 39.2 11/11/2020   MCV 82.7 11/11/2020   PLT 244.0 11/11/2020   Lab Results   Component Value Date   NA 138 11/11/2020   K 4.4 11/11/2020   CO2 29 11/11/2020   GLUCOSE 94 11/11/2020   BUN 15 11/11/2020   CREATININE 1.00 11/11/2020  BILITOT 0.7 11/11/2020   ALKPHOS 50 11/11/2020   AST 12 11/11/2020   ALT 10 11/11/2020   PROT 7.0 11/11/2020   ALBUMIN 4.7 11/11/2020   CALCIUM 9.4 11/11/2020   ANIONGAP 12 02/19/2018   GFR 108.72 11/11/2020   Lab Results  Component Value Date   CHOL 150 11/11/2020   Lab Results  Component Value Date   HDL 49.20 11/11/2020   Lab Results  Component Value Date   LDLCALC 82 11/11/2020   Lab Results  Component Value Date   TRIG 94.0 11/11/2020   Lab Results  Component Value Date   CHOLHDL 3 11/11/2020   No results found for: HGBA1C    Assessment & Plan:   Problem List Items Addressed This Visit       Digestive   Irritable bowel syndrome - Primary   Other Visit Diagnoses     Mild intermittent asthma without complication       Encounter to establish care           No orders of the defined types were placed in this encounter.   Follow-up: No follow-ups on file.   PLAN IBS: stable, no intervention warranted Asthma: stable, improving, no intervention warranted Advised to get 150 min + of exercise weekly spread over 4-6 days. Advised on healthy diet - limiting processed foods, controlling portions, mostly plants Return annually to check in, sooner with concerns Patient encouraged to call clinic with any questions, comments, or concerns.  I spent 27 minutes with this patient reviewing medical history, medications, and health maintenance  Janeece Agee, NP

## 2021-05-20 NOTE — Patient Instructions (Addendum)
Mr. Marques -   Randie Heinz to meet you.   Let me know if there's anything you need  Pop in once a year or so to touch base, sooner with any acute concerns.  Reach out if you need refills on albuterol  Thank you  Rich    If you have lab work done today you will be contacted with your lab results within the next 2 weeks.  If you have not heard from [place] then please contact us. The fastest way to get your results is to register for My Chart.   IF you received an x-ray today, you will receive an invoice from [facility]. Please contact [facility] at [phone] with questions or concerns regarding your invoice.   IF you received labwork today, you will receive an invoice from [facility]. Please contact [facility] at [phone] with questions or concerns regarding your invoice.   Our billing staff will not be able to assist you with questions regarding bills from these companies.  You will be contacted with the lab results as soon as they are available. The fastest way to get your results is to activate your My Chart account. Instructions are located on the last page of this paperwork. If you have not heard from [place] regarding the results in 2 weeks, please contact this office.

## 2022-07-27 ENCOUNTER — Other Ambulatory Visit: Payer: Self-pay | Admitting: Gastroenterology

## 2022-10-23 ENCOUNTER — Other Ambulatory Visit: Payer: Self-pay | Admitting: Gastroenterology

## 2022-11-01 ENCOUNTER — Telehealth: Payer: Self-pay | Admitting: Gastroenterology

## 2022-11-01 NOTE — Telephone Encounter (Signed)
Inbound call from patient mother requesting refill on bentyl, made appt for first available appt on 1/11. Please advise.

## 2022-11-02 MED ORDER — DICYCLOMINE HCL 10 MG PO CAPS: 10.0000 mg | ORAL_CAPSULE | Freq: Two times a day (BID) | ORAL | 0 refills | Status: DC

## 2022-11-02 NOTE — Telephone Encounter (Signed)
Script sent  

## 2022-12-01 ENCOUNTER — Ambulatory Visit: Payer: BC Managed Care – PPO | Admitting: Nurse Practitioner

## 2022-12-30 ENCOUNTER — Ambulatory Visit: Payer: BC Managed Care – PPO | Admitting: Nurse Practitioner

## 2022-12-30 ENCOUNTER — Encounter: Payer: Self-pay | Admitting: Nurse Practitioner

## 2022-12-30 VITALS — BP 122/78 | HR 75 | Ht 72.0 in | Wt 161.6 lb

## 2022-12-30 DIAGNOSIS — K589 Irritable bowel syndrome without diarrhea: Secondary | ICD-10-CM | POA: Diagnosis not present

## 2022-12-30 MED ORDER — DICYCLOMINE HCL 10 MG PO CAPS: 10.0000 mg | ORAL_CAPSULE | Freq: Two times a day (BID) | ORAL | 3 refills | Status: AC

## 2022-12-30 NOTE — Patient Instructions (Addendum)
We have sent the following medications to your pharmacy for you to pick up at your convenience: Dicyclomine 10 mg  Ibguard- 1 by mouth twice daily as needed  Follow up in 1 year.  Due to recent changes in healthcare laws, you may see the results of your imaging and laboratory studies on MyChart before your provider has had a chance to review them.  We understand that in some cases there may be results that are confusing or concerning to you. Not all laboratory results come back in the same time frame and the provider may be waiting for multiple results in order to interpret others.  Please give [place] 48 hours in order for your provider to thoroughly review all the results before contacting the office for clarification of your results.   Thank you for trusting me with your gastrointestinal care!   Carl Best, CRNP

## 2022-12-30 NOTE — Progress Notes (Signed)
12/30/2022 Matthew Harrington [id number] 04-21-2001   Chief Complaint: IBS follow-up, medication refill  History of Present Illness: Matthew Harrington is a 22 year old male with anxiety and IBS. He had a normal EGD and colonoscopy in February 2020.  He presents today for IBS follow-up and to renew his dicyclomine prescription.  He stated his IBS is a lot better at this time.  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  He stated this semester has been less stressful.  He describes his IBS symptoms as antral abdominal pain below the bellybutton comes and goes.  He sometimes experiences central lower abdominal discomfort when he takes dicyclomine.  Does not take hyoscyamine.  He notices drinking 2 to 3 glasses of water significantly reduces his abdominal pain.  No specific food triggers sometimes eating worsens his abdominal pain.  Past celiac serology was negative.  He denies having any constipation.  He passes a normal formed brown stool daily.  No rectal bleeding or black stools.  His appetite is good and his weight is stable.  No complaints today.      Latest Ref Rng & Units 11/11/2020    3:43 PM 02/19/2018   12:10 PM 01/02/2016    1:15 PM  CBC  WBC 4.5 - 10.5 K/uL 5.2  13.6  7.2   Hemoglobin 13.0 - 17.0 g/dL 13.5  14.5  12.6   Hematocrit 39.0 - 52.0 % 39.2  42.8  38.1   Platelets 150.0 - 400.0 K/uL 244.0  216  151        Latest Ref Rng & Units 11/11/2020    3:43 PM 02/19/2018   12:10 PM 01/02/2016    1:15 PM  CMP  Glucose 70 - 99 mg/dL 94  116  95   BUN 6 - 23 mg/dL 15  21  7   $ Creatinine 0.40 - 1.50 mg/dL 1.00  0.99  1.02   Sodium 135 - 145 mEq/L 138  138  137   Potassium 3.5 - 5.1 mEq/L 4.4  4.2  3.9   Chloride 96 - 112 mEq/L 104  103  106   CO2 19 - 32 mEq/L 29  23  23   $ Calcium 8.4 - 10.5 mg/dL 9.4  9.2  8.4   Total Protein 6.0 - 8.3 g/dL 7.0  7.5  6.4   Total Bilirubin 0.2 - 1.2 mg/dL 0.7  1.3  0.3   Alkaline Phos 39 - 117 U/L 50  71  94   AST 0 - 37 U/L  12  20  16   $ ALT 0 - 53 U/L 10  13  10     $ Current Outpatient Medications on File Prior to Visit  Medication Sig Dispense Refill   albuterol (VENTOLIN HFA) 108 (90 Base) MCG/ACT inhaler Inhale 1-2 puffs into the lungs every 6 (six) hours as needed for wheezing or shortness of breath.     Aloe Vera 25 MG CAPS Take 1 capsule by mouth daily.     BLACK ELDERBERRY PO Take 1 Dose by mouth daily.     cetirizine (ZYRTEC) 10 MG chewable tablet Chew 10 mg by mouth daily.     Cholecalciferol (VITAMIN D) 50 MCG (2000 UT) tablet Take 2,000 Units by mouth daily.     fluticasone (FLONASE) 50 MCG/ACT nasal spray Place 1 spray into both nostrils as needed for allergies or rhinitis.     hyoscyamine (LEVSIN SL) 0.125 MG SL tablet Take  1 tablet (0.125 mg total) by mouth every 6 (six) hours as needed. 30 tablet 2   Lactobacillus-Inulin ([place] PO) Take 1 tablet by mouth daily.     Probiotic Product (PROBIOTIC PO) Take 1 tablet by mouth daily.     vitamin C (ASCORBIC ACID) 500 MG tablet Take 500 mg by mouth daily.     No current facility-administered medications on file prior to visit.   No Known Allergies  Current Medications, Allergies, Past Medical History, Past Surgical History, Family History and Social History were reviewed in [employer] record.  Review of Systems:   Constitutional: Negative for fever, sweats, chills or weight loss.  Respiratory: Negative for shortness of breath.   Cardiovascular: Negative for chest pain, palpitations and leg swelling.  Gastrointestinal: See HPI.  Musculoskeletal: Negative for back pain or muscle aches.  Neurological: Negative for dizziness, headaches or paresthesias.   Physical Exam: BP 122/78   Pulse 75   Ht 6' (1.829 m)   Wt 161 lb 9.6 oz (73.3 kg)   SpO2 98%   BMI 21.92 kg/m  General: 22 year old male in no acute distress. Head: Normocephalic and atraumatic. Eyes: No scleral icterus. Conjunctiva pink . Ears:  Normal auditory acuity. Mouth: Dentition intact. No ulcers or lesions.  Lungs: Clear throughout to auscultation. Heart: Regular rate and rhythm, no murmur. Abdomen: Soft, nontender and nondistended. No masses or hepatomegaly. Normal bowel sounds x 4 quadrants.  Rectal: Deferred. Musculoskeletal: Symmetrical with no gross deformities. Extremities: No edema. Neurological: Alert oriented x 4. No focal deficits.  Psychological: Alert and cooperative. Normal mood and affect  Assessment and Recommendations:  22 year old male with IBS, intermittent central lower abdominal pain which has improved. Stress level has decreased.  He takes Dicyclomine 20 mg twice daily most days.  He no longer takes Hyoscyamine. -Dicyclomine 10 mg 1 p.o. twice daily as needed  -IBgard 1 p.o. twice daily as needed for abdominal pain -Diet as tolerated -Consider updated labs and CTAP if his abdominal pain worsens  -Follow-up in 1 year and as needed
